# Patient Record
Sex: Male | Born: 1960 | Race: Black or African American | Hispanic: No | Smoking: Current every day smoker
Health system: Southern US, Community
[De-identification: ages and names within clinical notes are randomized; demographics above are authoritative.]

---

## 1998-04-10 ENCOUNTER — Inpatient Hospital Stay (HOSPITAL_COMMUNITY): Admission: AD | Admit: 1998-04-10 | Discharge: 1998-04-15 | Payer: Self-pay | Admitting: *Deleted

## 2003-04-13 ENCOUNTER — Emergency Department (HOSPITAL_COMMUNITY): Admission: EM | Admit: 2003-04-13 | Discharge: 2003-04-13 | Payer: Self-pay | Admitting: Emergency Medicine

## 2003-04-16 ENCOUNTER — Ambulatory Visit (HOSPITAL_COMMUNITY): Admission: RE | Admit: 2003-04-16 | Discharge: 2003-04-16 | Payer: Self-pay | Admitting: Orthopaedic Surgery

## 2003-04-16 ENCOUNTER — Encounter: Payer: Self-pay | Admitting: Orthopaedic Surgery

## 2005-12-09 ENCOUNTER — Emergency Department (HOSPITAL_COMMUNITY): Admission: EM | Admit: 2005-12-09 | Discharge: 2005-12-09 | Payer: Self-pay | Admitting: Emergency Medicine

## 2009-02-01 ENCOUNTER — Emergency Department (HOSPITAL_COMMUNITY): Admission: EM | Admit: 2009-02-01 | Discharge: 2009-02-01 | Payer: Self-pay | Admitting: Emergency Medicine

## 2016-08-01 ENCOUNTER — Emergency Department (HOSPITAL_COMMUNITY): Payer: Non-veteran care

## 2016-08-01 ENCOUNTER — Emergency Department (HOSPITAL_COMMUNITY)
Admission: EM | Admit: 2016-08-01 | Discharge: 2016-08-01 | Disposition: A | Discharge status: Home / Self Care | Payer: Non-veteran care | Attending: Emergency Medicine | Admitting: Emergency Medicine

## 2016-08-01 ENCOUNTER — Encounter (HOSPITAL_COMMUNITY): Payer: Self-pay | Admitting: Emergency Medicine

## 2016-08-01 DIAGNOSIS — F17200 Nicotine dependence, unspecified, uncomplicated: Secondary | ICD-10-CM | POA: Diagnosis not present

## 2016-08-01 DIAGNOSIS — M25512 Pain in left shoulder: Secondary | ICD-10-CM

## 2016-08-01 MED ORDER — HYDROCODONE-ACETAMINOPHEN 5-325 MG PO TABS: 1.0000 | ORAL_TABLET | Freq: Four times a day (QID) | ORAL | 0 refills | Status: DC | PRN

## 2016-08-01 MED ORDER — KETOROLAC TROMETHAMINE 60 MG/2ML IM SOLN
60.0000 mg | Freq: Once | INTRAMUSCULAR | Status: AC
  Administered 2016-08-01: 60 mg via INTRAMUSCULAR
  Filled 2016-08-01: qty 2

## 2016-08-01 MED ORDER — HYDROCODONE-ACETAMINOPHEN 5-325 MG PO TABS
2.0000 | ORAL_TABLET | Freq: Once | ORAL | Status: AC
  Administered 2016-08-01: 2 via ORAL
  Filled 2016-08-01: qty 2

## 2016-08-01 MED ORDER — PREDNISONE 10 MG PO TABS: 20.0000 mg | ORAL_TABLET | Freq: Two times a day (BID) | ORAL | 0 refills | Status: DC

## 2016-08-01 NOTE — ED Triage Notes (Signed)
Left shoulder pain x 1 month, not relieved with home mediation

## 2016-08-01 NOTE — Discharge Instructions (Signed)
Prednisone as prescribed.  Hydrocodone as prescribed as needed for pain.  Wear sling for comfort.  Follow-up with your primary Dr. if you're not improving in the next week.

## 2016-08-01 NOTE — ED Provider Notes (Signed)
[facility]-EMERGENCY DEPT Provider Note   CSN: [id number] Arrival date & time: 08/01/16  [id number]  By signing my name below, I, Majel HomerPeyton Lee, attest that this documentation has been prepared under the direction and in the presence of Geoffery Lyonsouglas Annalyse Langlais, MD . Electronically Signed: Majel HomerPeyton Lee, Scribe. 08/01/2016. 8:29 AM.  History   Chief Complaint Chief Complaint  Patient presents with  . Shoulder Pain   The history is provided by the patient. No language interpreter was used.   HPI Comments: Richard Park is a 55 y.o. male who presents to the Emergency Department complaining of gradually worsening, left shoulder pain that began 1 month ago and worsened within the past few days. Pt reports his pain is centralized "behind his left shoulder blade" and is exacerbated with movement of his left arm. He states it feels as if "his arm is going to fall off." He notes he has hx of intermittent pain in his left shoulder that he associates with "weather changes." He states he typically takes Advil and Aleve to relieve his pain during these episodes; however, he has tried this same medication with no relief. Pt also reports he works at a [employer] in which he performs repetitive motions with his bilateral arms. He denies hx of shoulder problems, recent injury and numbness or tingling in his extremities.   History reviewed. No pertinent past medical history.  There are no active problems to display for this patient.  History reviewed. No pertinent surgical history.  Home Medications    Prior to Admission medications   Not on File    Family History No family history on file.  Social History Social History  Substance Use Topics  . Smoking status: Current Every Day Smoker    Packs/day: 0.50  . Smokeless tobacco: Never Used  . Alcohol use Yes     Allergies   Patient has no allergy information on record.  Review of Systems Review of Systems  Musculoskeletal: Positive for myalgias (left  shoulder).  Neurological: Negative for numbness.   Physical Exam Updated Vital Signs Ht 5\' 10"  (1.778 m)   Wt 154 lb (69.9 kg)   BMI 22.10 kg/m   Physical Exam  Constitutional: He is oriented to person, place, and time. He appears well-developed and well-nourished.  HENT:  Head: Normocephalic.  Eyes: EOM are normal.  Neck: Normal range of motion.  Pulmonary/Chest: Effort normal.  Abdominal: He exhibits no distension.  Musculoskeletal: Normal range of motion.  Left shoulder appears grossly normal. There is tenderness over scapula and posterior aspect of deltoid. There is pain with ROM. Ulnar and radial pulses are easily palpable. Can flex and extend all fingers. Sensation intact throughout the entire hand.   Neurological: He is alert and oriented to person, place, and time.  Psychiatric: He has a normal mood and affect.  Nursing note and vitals reviewed.  ED Treatments / Results  Labs (all labs ordered are listed, but only abnormal results are displayed) Labs Reviewed - No data to display  EKG  EKG Interpretation None       Radiology No results found.  Procedures Procedures (including critical care time)  Medications Ordered in ED Medications - No data to display  DIAGNOSTIC STUDIES:     COORDINATION OF CARE:  8:22 AM Discussed treatment plan with pt at bedside and pt agreed to plan.  Initial Impression / Assessment and Plan / ED Course  I have reviewed the triage vital signs and the nursing notes.  Pertinent labs &  imaging results that were available during my care of the patient were reviewed by me and considered in my medical decision making (see chart for details).  Clinical Course     Patient's history and physical examination is consistent with a rotator cuff tendinitis. He will be treated with an arm sling, prednisone, pain medication, and is to follow-up with his primary Dr. if he is not improving in the next week to discuss physical therapy or  possibly further imaging studies.  I personally performed the services described in this documentation, which was scribed in my presence. The recorded information has been reviewed and is accurate.   Final Clinical Impressions(s) / ED Diagnoses   Final diagnoses:  None    New Prescriptions New Prescriptions   No medications on file     Geoffery Lyonsouglas Annemarie Sebree, MD 08/01/16 [phone]

## 2016-08-01 NOTE — ED Notes (Signed)
Returned from  X-ray

## 2017-12-09 IMAGING — DX DG SHOULDER 2+V*L*
3 series · 3 of 3 positions shown · non-contrast
Comparison: Chest radiographs 12/09/2005.

CLINICAL DATA: 55-year-old male with chronic left shoulder pain.
Occupational heavy lifting. Initial encounter.

EXAM:
LEFT SHOULDER - 2+ VIEW

[shoulder ap (1 of 2)]
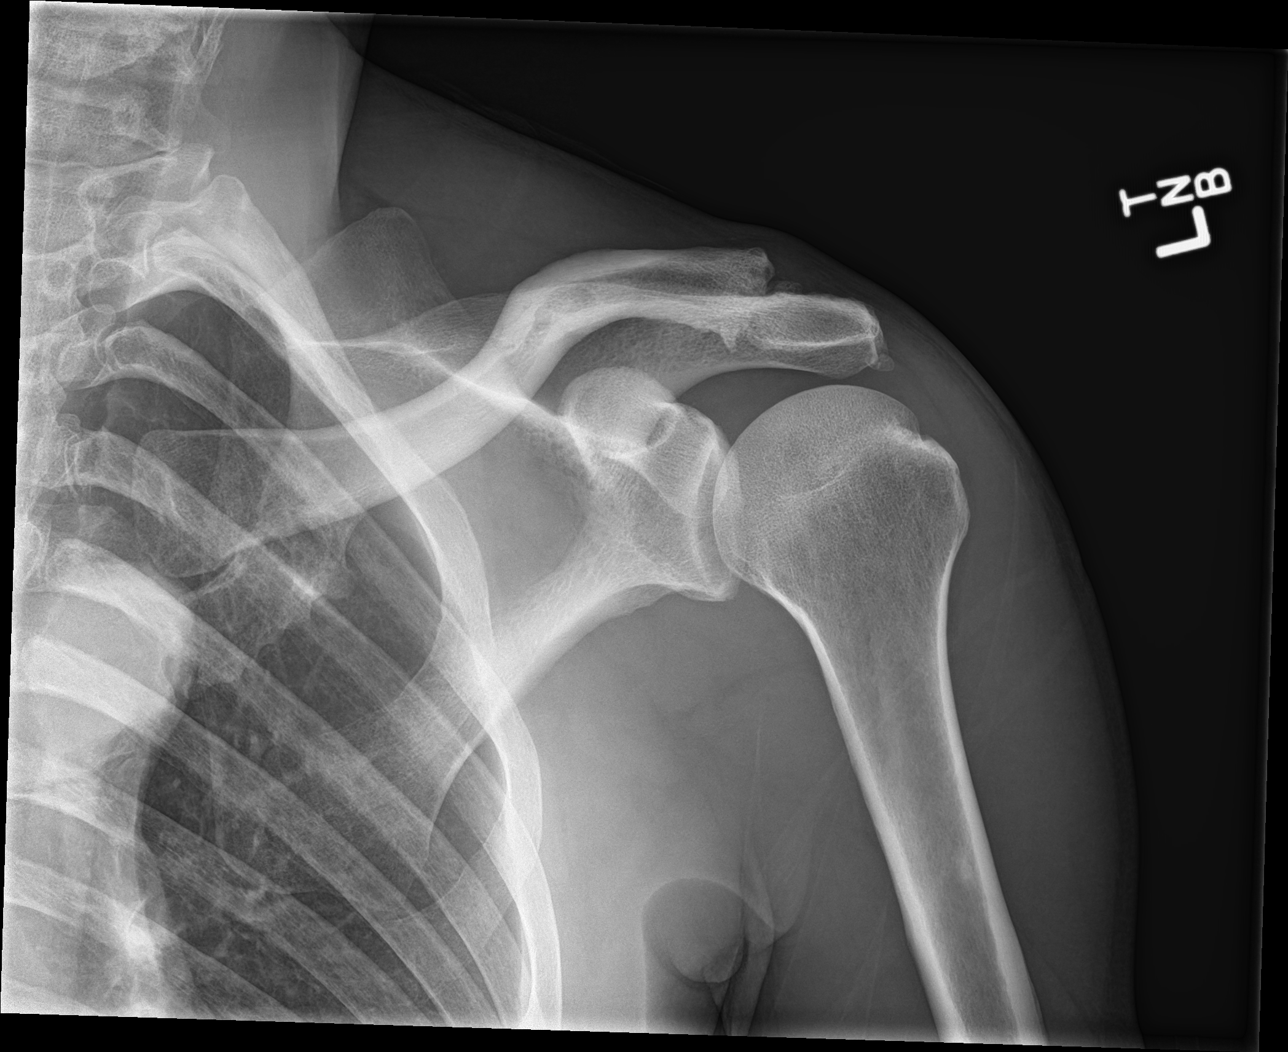

[shoulder ap (2 of 2)]
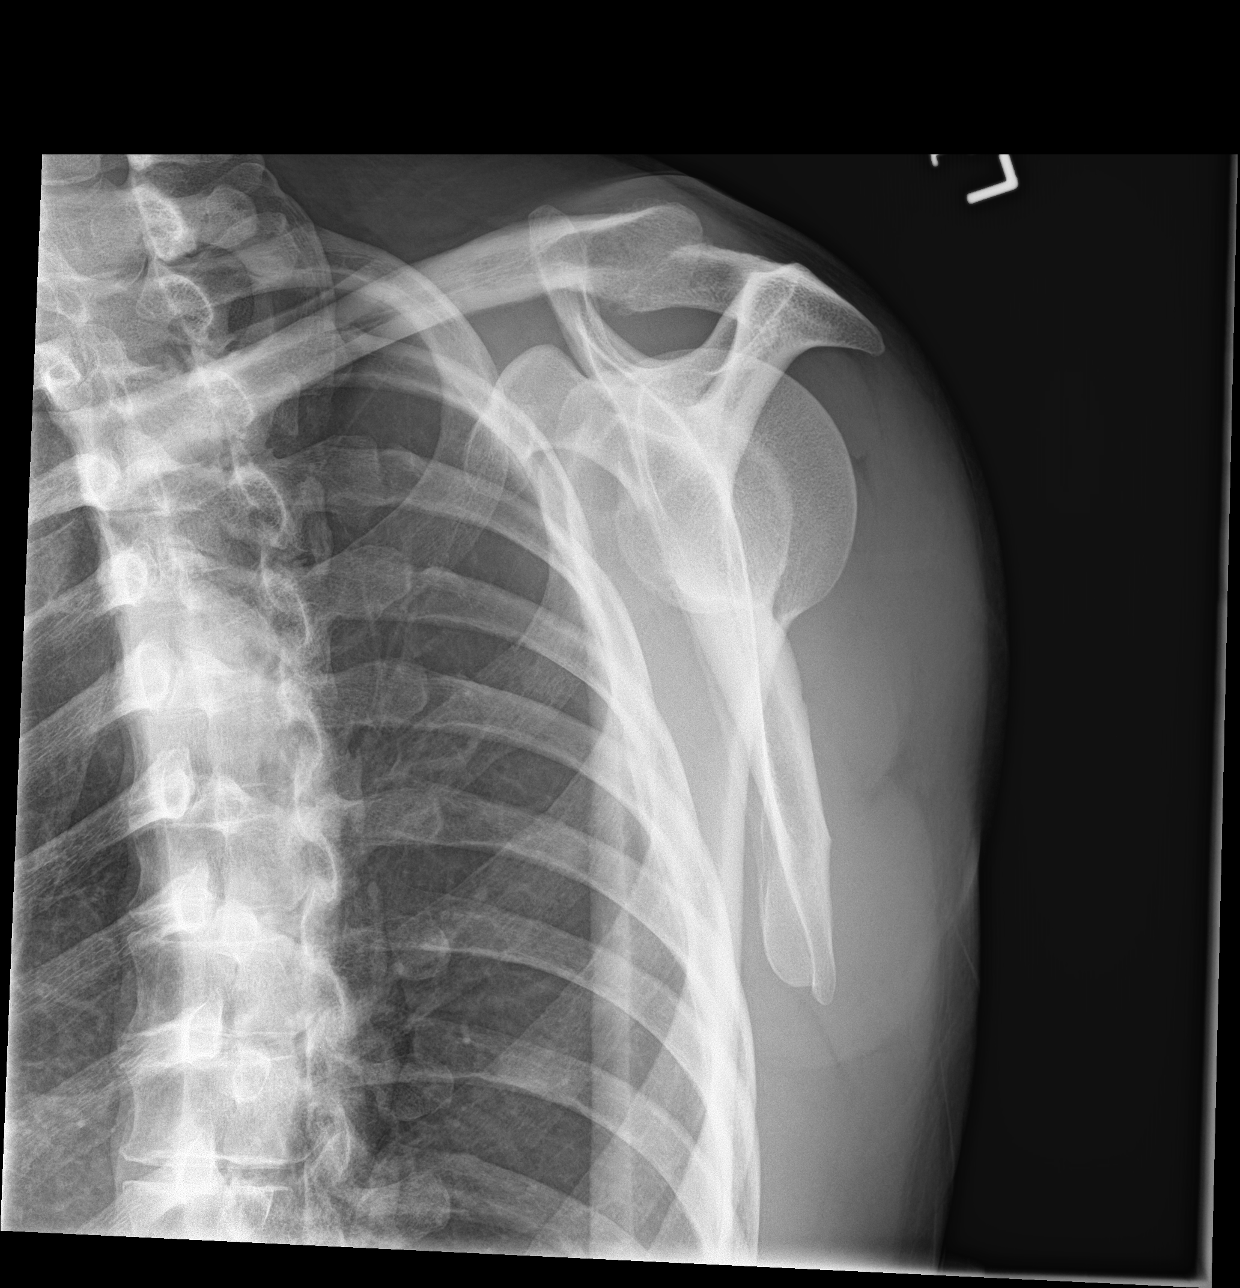

[shoulder axillary]
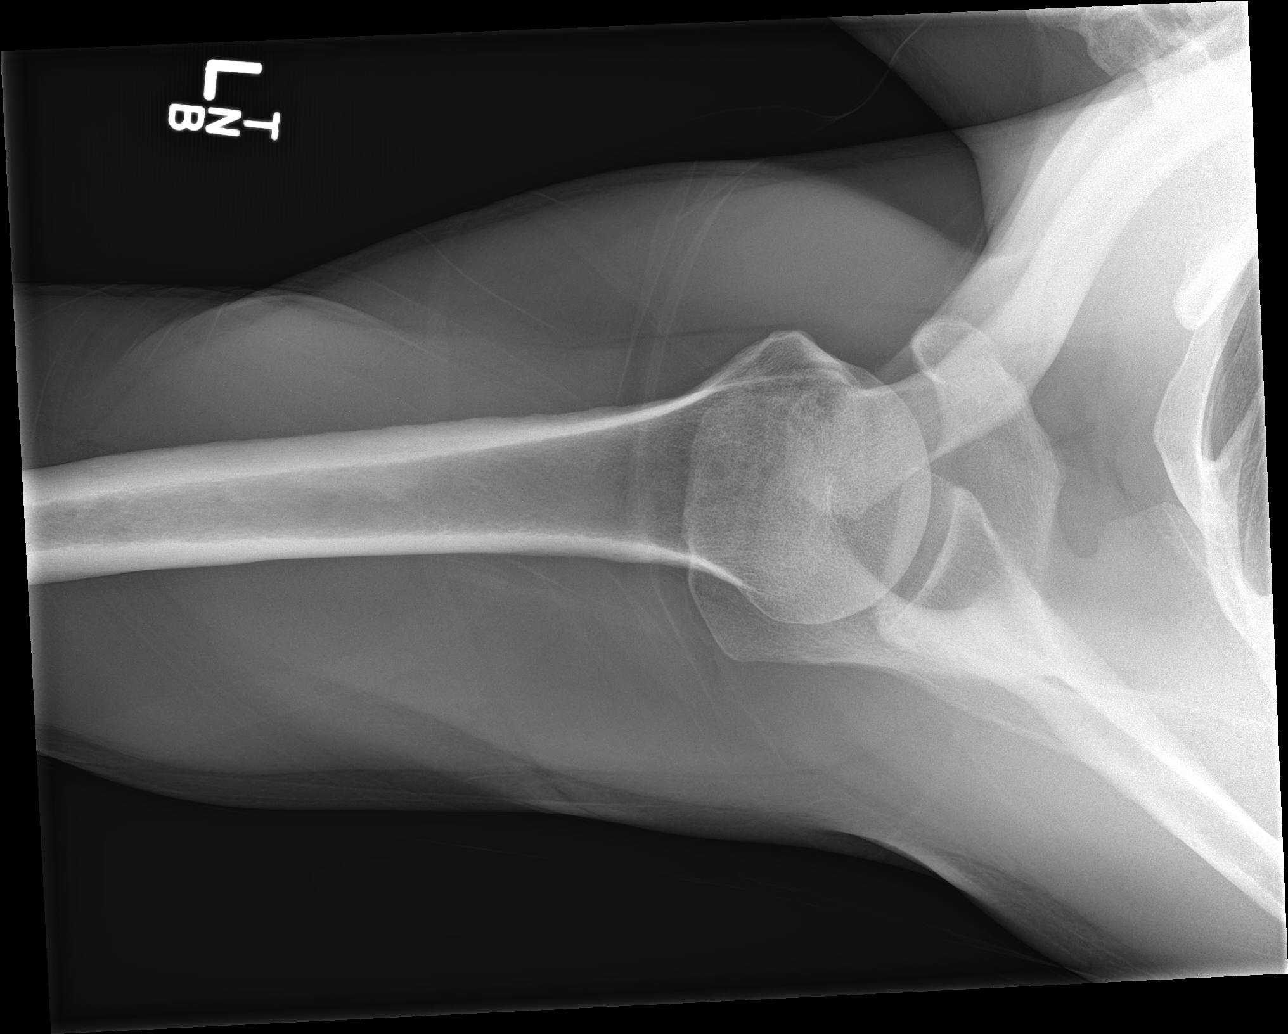

[3 of 3 positions shown; findings below may reference images not displayed]

FINDINGS: Small chronic ossific fragments about the left acromioclavicular
joint and acromion. No glenohumeral joint dislocation. Proximal left
humerus intact. Normal underlying bone mineralization. Left scapula
intact. Visible left ribs and lung parenchyma appear normal. No
acute osseous abnormality identified.
IMPRESSION: Small chronic posttraumatic and/or degenerative ossific fragments
about the left acromion and AC joint. No acute osseous abnormality
identified.

## 2019-02-18 ENCOUNTER — Encounter (HOSPITAL_COMMUNITY): Payer: Self-pay | Admitting: *Deleted

## 2019-02-18 ENCOUNTER — Emergency Department (HOSPITAL_COMMUNITY)
Admission: EM | Admit: 2019-02-18 | Discharge: 2019-02-18 | Disposition: A | Discharge status: Home / Self Care | Payer: No Typology Code available for payment source | Attending: Emergency Medicine | Admitting: Emergency Medicine

## 2019-02-18 ENCOUNTER — Other Ambulatory Visit: Payer: Self-pay

## 2019-02-18 DIAGNOSIS — R600 Localized edema: Secondary | ICD-10-CM | POA: Diagnosis present

## 2019-02-18 DIAGNOSIS — K047 Periapical abscess without sinus: Secondary | ICD-10-CM | POA: Diagnosis not present

## 2019-02-18 DIAGNOSIS — F17200 Nicotine dependence, unspecified, uncomplicated: Secondary | ICD-10-CM | POA: Insufficient documentation

## 2019-02-18 DIAGNOSIS — R220 Localized swelling, mass and lump, head: Secondary | ICD-10-CM

## 2019-02-18 MED ORDER — HYDROCODONE-ACETAMINOPHEN 5-325 MG PO TABS: 1.0000 | ORAL_TABLET | ORAL | 0 refills | Status: AC | PRN

## 2019-02-18 MED ORDER — IBUPROFEN 400 MG PO TABS
400.0000 mg | ORAL_TABLET | Freq: Once | ORAL | Status: AC
  Administered 2019-02-18: 400 mg via ORAL
  Filled 2019-02-18: qty 1

## 2019-02-18 MED ORDER — ONDANSETRON HCL 4 MG PO TABS
4.0000 mg | ORAL_TABLET | Freq: Once | ORAL | Status: AC
  Administered 2019-02-18: 4 mg via ORAL
  Filled 2019-02-18: qty 1

## 2019-02-18 MED ORDER — HYDROCODONE-ACETAMINOPHEN 5-325 MG PO TABS
2.0000 | ORAL_TABLET | Freq: Once | ORAL | Status: AC
  Administered 2019-02-18: 2 via ORAL
  Filled 2019-02-18: qty 2

## 2019-02-18 MED ORDER — CLINDAMYCIN HCL 150 MG PO CAPS: 150.0000 mg | ORAL_CAPSULE | Freq: Four times a day (QID) | ORAL | 0 refills | Status: DC

## 2019-02-18 MED ORDER — CLINDAMYCIN HCL 150 MG PO CAPS
300.0000 mg | ORAL_CAPSULE | Freq: Once | ORAL | Status: AC
  Administered 2019-02-18: 300 mg via ORAL
  Filled 2019-02-18: qty 2

## 2019-02-18 MED ORDER — CLINDAMYCIN HCL 300 MG PO CAPS: 300.0000 mg | ORAL_CAPSULE | Freq: Three times a day (TID) | ORAL | 0 refills | Status: AC

## 2019-02-18 NOTE — ED Notes (Signed)
PA aware of bp.  Instructed pt to recheck when he was not in pain and if it remained elevated to follow up with a provider.

## 2019-02-18 NOTE — ED Triage Notes (Signed)
Facial swelling right side with dental pain onset yesterday

## 2019-02-18 NOTE — ED Provider Notes (Signed)
[facility] EMERGENCY DEPARTMENT Provider Note   CSN: [id number] Arrival date & time: 02/18/19  1007    History   Chief Complaint Chief Complaint  Patient presents with  . Facial Swelling  . Dental Pain    HPI Richard Park is a 58 y.o. male.     The history is provided by the patient.  Dental Pain  Location:  Upper Upper teeth location:  4/RU 2nd bicuspid Quality:  Throbbing and shooting Severity:  Moderate Onset quality:  Gradual Duration:  1 day Timing:  Constant Progression:  Worsening Context: abscess, dental caries and poor dentition   Relieved by:  Nothing Worsened by:  Touching, cold food/drink and hot food/drink Ineffective treatments:  Acetaminophen and NSAIDs Associated symptoms: facial pain, facial swelling, gum swelling and headaches   Associated symptoms: no difficulty swallowing, no drooling, no fever and no neck pain   Risk factors: lack of dental care and smoking     History reviewed. No pertinent past medical history.  There are no active problems to display for this patient.   Past Surgical History:  Procedure Laterality Date  . SKIN GRAFT          Home Medications    Prior to Admission medications   Not on File    Family History No family history on file.  Social History Social History   Tobacco Use  . Smoking status: Current Every Day Smoker    Packs/day: 0.50  . Smokeless tobacco: Never Used  Substance Use Topics  . Alcohol use: Yes  . Drug use: No     Allergies   Patient has no allergy information on record.   Review of Systems Review of Systems  Constitutional: Negative for activity change and fever.       All ROS Neg except as noted in HPI  HENT: Positive for facial swelling. Negative for drooling and nosebleeds.   Eyes: Negative for photophobia and discharge.  Respiratory: Negative for cough, shortness of breath and wheezing.   Cardiovascular: Negative for chest pain and palpitations.   Gastrointestinal: Negative for abdominal pain and blood in stool.  Genitourinary: Negative for dysuria, frequency and hematuria.  Musculoskeletal: Negative for arthralgias, back pain and neck pain.  Skin: Negative.   Neurological: Positive for headaches. Negative for dizziness, seizures and speech difficulty.  Psychiatric/Behavioral: Negative for confusion and hallucinations.     Physical Exam Updated Vital Signs BP (!) 143/100   Pulse 86   Temp 97.7 F (36.5 C)   Resp 20   Ht 5\' 10"  (1.778 m)   Wt 68 kg   SpO2 98%   BMI 21.52 kg/m   Physical Exam Vitals signs and nursing note reviewed.  Constitutional:      Appearance: He is well-developed. He is not toxic-appearing.  HENT:     Head: Normocephalic.     Jaw: Tenderness, swelling and pain on movement present.      Right Ear: Tympanic membrane and external ear normal.     Left Ear: Tympanic membrane and external ear normal.  Eyes:     General: Lids are normal.     Pupils: Pupils are equal, round, and reactive to light.  Neck:     Musculoskeletal: Normal range of motion and neck supple.     Vascular: No carotid bruit.  Cardiovascular:     Rate and Rhythm: Normal rate and regular rhythm.     Pulses: Normal pulses.     Heart sounds: Normal heart sounds.  Pulmonary:  Effort: No respiratory distress.     Breath sounds: Normal breath sounds.  Abdominal:     General: Bowel sounds are normal.     Palpations: Abdomen is soft.     Tenderness: There is no abdominal tenderness. There is no guarding.  Musculoskeletal: Normal range of motion.  Lymphadenopathy:     Head:     Right side of head: No submandibular adenopathy.     Left side of head: No submandibular adenopathy.     Cervical: No cervical adenopathy.  Skin:    General: Skin is warm and dry.  Neurological:     Mental Status: He is alert and oriented to person, place, and time.     Cranial Nerves: No cranial nerve deficit.     Sensory: No sensory deficit.   Psychiatric:        Speech: Speech normal.      ED Treatments / Results  Labs (all labs ordered are listed, but only abnormal results are displayed) Labs Reviewed - No data to display  EKG None  Radiology No results found.  Procedures Procedures (including critical care time)  Medications Ordered in ED Medications  clindamycin (CLEOCIN) capsule 300 mg (has no administration in time range)  ondansetron (ZOFRAN) tablet 4 mg (has no administration in time range)  HYDROcodone-acetaminophen (NORCO/VICODIN) 5-325 MG per tablet 2 tablet (has no administration in time range)  ibuprofen (ADVIL) tablet 400 mg (has no administration in time range)     Initial Impression / Assessment and Plan / ED Course  I have reviewed the triage vital signs and the nursing notes.  Pertinent labs & imaging results that were available during my care of the patient were reviewed by me and considered in my medical decision making (see chart for details).          Final Clinical Impressions(s) / ED Diagnoses MDM  Blood pressure elevated. I have ask pt to have this rechecked soon. Pt has an infected right upper pre-molar tooth with multiple other dental caries. No evidence for Ludwig's Angina or other emergent changes.   Rx for clindamycin and norco given to the patient. Pt strongly advised to see a dentist as soon as possible. Pt in agreement with plan.    Final diagnoses:  Dental abscess  Facial swelling    ED Discharge Orders         Ordered    clindamycin (CLEOCIN) 150 MG capsule  Every 6 hours,   Status:  Discontinued     02/18/19 1127    HYDROcodone-acetaminophen (NORCO/VICODIN) 5-325 MG tablet  Every 4 hours PRN     02/18/19 1127    clindamycin (CLEOCIN) 300 MG capsule  3 times daily     02/18/19 1132           Ivery QualeBryant, Ioane Bhola, PA-C 02/18/19 2253    Vanetta MuldersZackowski, Scott, MD 02/20/19 1825

## 2019-02-18 NOTE — Discharge Instructions (Addendum)
You have a deep cavity on the right upper gum area.  It appears that there is an abscess accompanied by facial swelling.  You have been placed on clindamycin three times daily. Use 400mg  of ibuprofen three times daily with meal/food. Use Norco for severe pain. This medication may cause drowsiness. Please do not drink, drive, or participate in activity that requires concentration while taking this medication.  It is IMPORTANT that you see a dentist as soon as possible.

## 2019-12-30 ENCOUNTER — Ambulatory Visit: Payer: Non-veteran care | Attending: Internal Medicine

## 2019-12-30 ENCOUNTER — Other Ambulatory Visit: Payer: Self-pay

## 2019-12-30 DIAGNOSIS — Z20822 Contact with and (suspected) exposure to covid-19: Secondary | ICD-10-CM

## 2020-01-01 LAB — SARS-COV-2, NAA 2 DAY TAT

## 2020-01-01 LAB — NOVEL CORONAVIRUS, NAA: SARS-CoV-2, NAA: NOT DETECTED

## 2024-09-24 ENCOUNTER — Ambulatory Visit
